# Patient Record
Sex: Male | Born: 1985 | Race: Asian | Hispanic: No | Marital: Married
Health system: Midwestern US, Community
[De-identification: ages and names within clinical notes are randomized; demographics above are authoritative.]

---

## 2010-02-26 LAB — URINALYSIS W/ RFLX MICROSCOPIC
Bilirubin: NEGATIVE
Blood: NEGATIVE
Glucose: NEGATIVE MG/DL
Ketone: 15 MG/DL — AB
Leukocyte Esterase: NEGATIVE
Nitrites: NEGATIVE
Protein: NEGATIVE MG/DL
Specific gravity: 1.025 (ref 1.003–1.030)
Urobilinogen: 1 EU/DL (ref 0.2–1.0)
pH (UA): 7 (ref 5.0–8.0)

## 2010-02-27 LAB — CBC WITH AUTOMATED DIFF
ABS. BASOPHILS: 0 10*3/uL (ref 0.0–0.1)
ABS. EOSINOPHILS: 0 10*3/uL (ref 0.0–0.4)
ABS. LYMPHOCYTES: 1.9 10*3/uL (ref 0.9–3.6)
ABS. MONOCYTES: 0.5 10*3/uL (ref 0.05–1.2)
ABS. NEUTROPHILS: 7.9 10*3/uL (ref 1.8–8.0)
BASOPHILS: 0 % (ref 0–2)
EOSINOPHILS: 0 % (ref 0–5)
HCT: 42.9 % (ref 36.0–48.0)
HGB: 14.7 g/dL (ref 13.0–16.0)
LYMPHOCYTES: 18 % — ABNORMAL LOW (ref 21–52)
MCH: 32.1 PG (ref 24.0–34.0)
MCHC: 34.3 g/dL (ref 31.0–37.0)
MCV: 93.7 FL (ref 74.0–97.0)
MONOCYTES: 5 % (ref 3–10)
MPV: 9.9 FL (ref 9.2–11.8)
NEUTROPHILS: 77 % — ABNORMAL HIGH (ref 40–73)
PLATELET: 338 10*3/uL (ref 135–420)
RBC: 4.58 M/uL — ABNORMAL LOW (ref 4.70–5.50)
RDW: 12.2 % (ref 11.6–14.5)
WBC: 10.3 10*3/uL (ref 4.6–13.2)

## 2010-09-13 LAB — CBC WITH AUTOMATED DIFF
ABS. BASOPHILS: 0.1 10*3/uL — ABNORMAL HIGH (ref 0.0–0.06)
ABS. EOSINOPHILS: 0.1 10*3/uL (ref 0.0–0.4)
ABS. LYMPHOCYTES: 2.2 10*3/uL (ref 0.9–3.6)
ABS. MONOCYTES: 0.4 10*3/uL (ref 0.05–1.2)
ABS. NEUTROPHILS: 5.6 10*3/uL (ref 1.8–8.0)
BASOPHILS: 1 % (ref 0–2)
EOSINOPHILS: 1 % (ref 0–5)
HCT: 43.2 % (ref 36.0–48.0)
HGB: 14.3 g/dL (ref 13.0–16.0)
LYMPHOCYTES: 27 % (ref 21–52)
MCH: 31 PG (ref 24.0–34.0)
MCHC: 33.1 g/dL (ref 31.0–37.0)
MCV: 93.5 FL (ref 74.0–97.0)
MONOCYTES: 5 % (ref 3–10)
MPV: 9.9 FL (ref 9.2–11.8)
NEUTROPHILS: 66 % (ref 40–73)
PLATELET: 349 10*3/uL (ref 135–420)
RBC: 4.62 M/uL — ABNORMAL LOW (ref 4.70–5.50)
RDW: 12.2 % (ref 11.6–14.5)
WBC: 8.4 10*3/uL (ref 4.6–13.2)

## 2010-09-13 LAB — URINALYSIS W/ RFLX MICROSCOPIC
Bilirubin: NEGATIVE
Blood: NEGATIVE
Glucose: NEGATIVE MG/DL
Ketone: NEGATIVE MG/DL
Leukocyte Esterase: NEGATIVE
Nitrites: NEGATIVE
Protein: NEGATIVE MG/DL
Specific gravity: 1.025 (ref 1.003–1.030)
Urobilinogen: 0.2 EU/DL (ref 0.2–1.0)
pH (UA): 5.5 (ref 5.0–8.0)

## 2010-09-13 LAB — METABOLIC PANEL, COMPREHENSIVE
A-G Ratio: 1.4 (ref 0.8–1.7)
ALT (SGPT): 49 U/L (ref 30–65)
AST (SGOT): 17 U/L (ref 15–37)
Albumin: 4.4 g/dL (ref 3.4–5.0)
Alk. phosphatase: 94 U/L (ref 50–136)
Anion gap: 14 mmol/L (ref 5–15)
BUN/Creatinine ratio: 13 (ref 12–20)
BUN: 13 MG/DL (ref 7–18)
Bilirubin, total: 0.5 MG/DL (ref 0.2–1.0)
CO2: 24 MMOL/L (ref 21–32)
Calcium: 8.8 MG/DL (ref 8.4–10.4)
Chloride: 104 MMOL/L (ref 100–108)
Creatinine: 1 MG/DL (ref 0.6–1.3)
GFR est AA: >60 mL/min/{1.73_m2} (ref >60)
GFR est non-AA: >60 mL/min/{1.73_m2} (ref >60)
Globulin: 3.1 g/dL (ref 2.0–4.0)
Glucose: 95 MG/DL (ref 74–99)
Potassium: 3.8 MMOL/L (ref 3.5–5.5)
Protein, total: 7.5 g/dL (ref 6.4–8.2)
Sodium: 142 MMOL/L (ref 136–145)

## 2010-09-13 LAB — LIPASE: Lipase: 106 U/L (ref 73–393)

## 2010-09-25 LAB — URINALYSIS W/ RFLX MICROSCOPIC
Bilirubin: NEGATIVE
Blood: NEGATIVE
Glucose: NEGATIVE MG/DL
Ketone: NEGATIVE MG/DL
Leukocyte Esterase: NEGATIVE
Nitrites: NEGATIVE
Protein: NEGATIVE MG/DL
Specific gravity: 1.02 (ref 1.003–1.030)
Urobilinogen: 0.2 EU/DL (ref 0.2–1.0)
pH (UA): 6 (ref 5.0–8.0)

## 2010-09-25 LAB — DRUG SCREEN, URINE
AMPHETAMINES: NEGATIVE
BARBITURATES: NEGATIVE
BENZODIAZEPINES: NEGATIVE
COCAINE: NEGATIVE
METHADONE: NEGATIVE
OPIATES: NEGATIVE
PCP(PHENCYCLIDINE): NEGATIVE
THC (TH-CANNABINOL): NEGATIVE

## 2012-06-14 NOTE — ED Provider Notes (Signed)
[facility]  EMERGENCY DEPARTMENT TREATMENT REPORT  NAME:  Barry Crawford  SEX:   M  ADMIT: 06/13/2012  DOB:   [birth year]  MR#    [id number]  ROOM:    TIME SEEN: 04 48 PM  ACCT#  [id number]        TIME OF EVALUATION:  1617    CHIEF COMPLAINT:  Shoulder and back pain.    HISTORY OF PRESENT ILLNESS:  This is a 26 year old male who tripped over his nephew's toy yesterday evening   at about 1800 hours, fell on a hard floor.  He did not hit his head.  He was   not knocked unconscious.  He states he also injured his right shoulder,    describes kind of a jamming mechanism to his right shoulder. He has had pain   in his shoulder since that time.  He cannot really lift it up or move it   across his body. He  has pain across his low back, but no pain radiating to   his legs.  No paresthesias, no weakness.  Denies any neck pain.  He denies any   chest pain. Denies any shortness of breath.  Currently rates his pain a 10 on   a 0 to 10 scale.    REVIEW OF SYSTEMS:  CONSTITUTIONAL:  There has been no recent illness.  ENT:  No sore throat, runny nose, or other URI symptoms.   RESPIRATORY:  No cough, shortness of breath, or wheezing.   CARDIOVASCULAR:  No chest pain, chest pressure, or palpitations.   GASTROINTESTINAL:  No vomiting, diarrhea, or abdominal pain.   MUSCULOSKELETAL: Positive right shoulder pain.  Positive for low back pain.  NEUROLOGIC:  He did not hit his head.  There was no loss of consciousness.    Denies any paresthesias or weakness.  No bowel or bladder incontinence or   retention.  Denies complaints in all other systems.     PAST MEDICAL HISTORY:  He denies any medical problems.    MEDICATIONS:  None.    ALLERGIES:  NO KNOWN DRUG ALLERGIES.    SOCIAL HISTORY:  States his wife brought him here today.    PHYSICAL EXAMINATION:  GENERAL:  A well-developed male.  VITAL SIGNS:  Blood pressure 147/89, pulse is 90, respirations 18, temperature   97.8.  HEENT:  Head normocephalic, atraumatic.   NECK:  Supple, nontender.  LUNGS:  Clear to auscultation.  HEART:  Has a regular rate and rhythm.  CHEST/CLAVICLES: There is no sternoclavicular tenderness.  Sternum is   nontender.  He has some tenderness to palpation of the right shoulder and pain   with movement of the right shoulder.  No obvious deformity.  Left shoulder is   nontender.  Remainder of the chest is nontender.  ABDOMEN:  Nontender.  BACK:  He has some tenderness kind of over the parathoracic, posteriorly lower   in paralumbar region as well as some midline tenderness to the lumbar spine.    There is no crepitus or step-off.  He is sitting up on the bed.  HIPS/PELVIS:  Intact, nontender.  EXTREMITIES:  Right upper extremity is well perfused, has a good radial pulse.    Good capillary refill.  Sensation intact.  He is able to wiggle all of his   fingers.  Left upper extremity atraumatic.  Lower extremities are atraumatic.  NEUROLOGIC:  DTRs intact and symmetrical.  Strength testing intact.  Sensation   is intact.    IMPRESSION  AND MANAGEMENT PLAN:  This is a 26 year old male who presents for evaluation after a fall.  Obtain   x-rays of his shoulder and his lumbar spine to evaluate for acute bony injury.    Given a Percocet here in the department, and we will proceed from there.     CONTINUATION BY JULIA C. HUBBARD, PA     IMPRESSION AND MANAGEMENT PLAN:  A  26 year old male with injury to his right shoulder, his lumbar low back.    At this time, x-rays of his lumbar spine will obtained.  Due to midline   tenderness, an x-ray of his right shoulder will be obtained. We will go ahead   and give him a Percocet here in the department.    DIAGNOSTIC STUDIES:  X-rays of lumbar spine show no fracture or dislocation per Radiology.  X-rays   of right shoulder show no evidence of right shoulder fracture or dislocation   per Radiology.    COURSE IN THE EMERGENCY DEPARTMENT:  Findings were discussed.  At this time, discussed symptomatic treatment,    placed in a sling for the next 2 to 3 days.  Ice to the shoulder.  I have   written a  prescription for Norco, Robaxin and Naprosyn to go home with, have   him follow up with ortho if not improving over the next 3 to 4 days, to   certainly seek medical attention at any time for worsening or new concerns.    FINAL DIAGNOSES:  1.  Evaluation, acute right  shoulder strain.  2.  Acute lumbar back strain status post fall.    DISPOSITION AND PLAN:  The patient was discharged home in stable condition to follow up as above.    The patient was examined and evaluated by myself and Jannetta Quint, MD who   agrees with the above assessment and plan.      ___________________  Lucio Edward MD  Dictated By: Maurice Small. Williams Che, [place]    My signature above authenticates this document and my orders, the final   diagnosis (es), discharge prescription (s), and instructions in the [facility]   Pulsecheck record.  PB  D:06/13/2012  T: 06/14/2012 00:14:56  [id number]  Authenticated by Lucio Edward, MD On 06/28/2012 08:44:25 AM

## 2013-07-18 NOTE — ED Provider Notes (Signed)
[facility]  EMERGENCY DEPARTMENT TREATMENT REPORT  NAME:  Barry Crawford  SEX:   M  ADMIT: 07/17/2013  DOB:   19-Oct-1985  MR#    [id number]  ROOM:    TIME DICTATED: 03 18 PM  ACCT#  [id number]        CHIEF COMPLAINT:  Back pain.    HISTORY OF PRESENT ILLNESS:  The patient is a 27 year old male who reports a fall onto his backside about 3   weeks ago.  He reports that he slipped and fell on a wet surface and had pain   across his low back.  He denies any head injury.  He states that he has been   having pain in his low back to his coccyx area and down his right leg since   that fall.  He denies seeing any doctor for this injury.  He has never had any   symptoms previously.  He states that he is walking with a limp secondary to   pain.    REVIEW OF SYSTEMS:  CONSTITUTIONAL:  He denies fevers, chills, malaise.  HEENT:  Head:  No injury.  NEUROLOGIC:  Denies numbness, tingling, weakness, loss of bowel or bladder   control.  MUSCULOSKELETAL:  See HPI.  HEMATOLOGIC:  No history of anemia, bleeding, or bruising.  SKIN:  No rashes, abrasions, ecchymosis, or lacerations.  All other review of   systems is negative.      PAST MEDICAL HISTORY:     He has a normal past medical history except that he has a history of gastric   ulcers.      PAST SURGICAL HISTORY:    He denies any surgeries.    SOCIAL HISTORY:  He smokes tobacco.    ALLERGIES:  TRAMADOL.    MEDICATIONS:  He is not currently taking any medications.      PHYSICAL EXAMINATION:   VITAL SIGNS:   Reviewed and within normal limits.    GENERAL:    He is a healthy-appearing 27 year old Asian male in no acute   distress.  He is having difficulty moving around the exam room and sitting up   on the exam table.    HEENT:  Head is symmetric, atraumatic.    MUSCULOSKELETAL:   SPINE:  He has no midline tenderness of the thoracic or   lumbar spine.  No tenderness to the coccyx.  He does have tenderness in the    sacral area and into the right buttock.  He has a positive straight leg raise   on the right.  He has forward flexion of about 15 degrees secondary to pain.    Extension is 0, rotation is 0, lateral flexion is 0 degrees bilaterally   secondary to pain, as the patient is just unwilling to participate in range of   motion secondary to pain.    SKIN:   Clean, dry and intact.  No ecchymosis.  He has normal perfusion into   both feet.  NEUROLOGIC:  He has 5/5 muscle strength in bilateral lower extremities in hip   flexion and knee flexion, extension.  He has 3/5 weakness and foot drop on the   right foot.  He was observed ambulating with ease in and out of the Emergency   Department, although at the time of my exam, he was walking with a limp.    This improved a great deal after pain medication.     IMAGING:    X-rays were reviewed by myself  and Dr. Buddy Duty.  No official read but there was   no acute malalignment or obvious fractures.  I have a low suspicion for   fracture.      EMERGENCY DEPARTMENT COURSE:    The patient was treated in the Emergency Department with a muscle relaxant and   pain medication.  He has an acute sciatica secondary to the fall.  He has no   insurance, so he was given instructions to but that he was given instructions   for followup and referral information for the [facility] if he   continues to have pain.  He was given a prednisone taper for acute   inflammation.  He was also given Prilosec to protect the stomach with   anti-inflammatories.    DIAGNOSIS:  Lumbar radiculopathy.    DISPOSITION:  Discharged home in stable condition.      The patient was personally evaluated by myself and Dr. Jannetta Quint, who   agrees with the above assessment and plan.      ___________________  Lucio Edward MD  Dictated By: Crecencio Mc, PA-C    My signature above authenticates this document and my orders, the final   diagnosis (es), discharge prescription (s), and instructions in the [facility]   Pulsecheck record.  Nursing notes have been reviewed by the physician/mid-level provider.    If you have any questions please contact [phone].    KB  D:07/18/2013 15:18:08  T: 07/18/2013 17:14:12  [id number]  Authenticated by Lucio Edward, MD On 08/03/2013 10:21:35 PM

## 2013-07-20 NOTE — ED Provider Notes (Signed)
[facility]  EMERGENCY DEPARTMENT TREATMENT REPORT  NAME:  Barry Crawford  SEX:   M  ADMIT: 07/20/2013  DOB:   1986-06-26  MR#    [id number]  ROOM:    TIME DICTATED: 02 32 PM  ACCT#  [id number]        DATE AND TIME OF EVALUATION:  Wednesday, 07/20/2013    CHIEF COMPLAINT:  Left foot injury.    HISTORY OF PRESENT ILLNESS:  This is a 27 year old male who was on his father-in-law's porch today when he   rolled his left ankle.  He denies any fall or head injury.  He says that his   left ankle is painful with 10 out of 10 pain.  He took Vicodin for pain just   prior to the incident happening.    REVIEW OF SYSTEMS:  MUSCULOSKELETAL:  Right joint pain but no swelling.  NEUROLOGIC:  No sensory or motor deficit.  INTEGUMENTARY:  No rashes, lacerations or abrasions.    MEDICATIONS AND ALLERGIES:   Reviewed in Ibex.    PHYSICAL EXAMINATION:  GENERAL APPEARANCE:  Well-developed, well-nourished male lying on exam table   in no acute distress.  VITAL SIGNS:  Blood pressure is 147/88, pulse is 117, respirations 20,   temperature 97.8, O2 saturation 99% on room air.  MUSCULOSKELETAL:  Left ankle without edema, bruising or ecchymosis.  No   obvious deformity is noted.  Dorsalis pedis pulses 2+ bilaterally.  The   patient's feet are warm to the touch.  Capillary refill is brisk.  He does   have reduced range of motion of all the toes on the left foot.    CONTINUED BY SARAH GREGORY, PA-C:     ASSESSMENT AND MANAGEMENT PLAN:     This 27 year old male twisted his left ankle, we will go ahead and do an   x-ray.  Will also give ibuprofen and 8 mg IM morphine as well as 4 mg p.o.   Zofran for pain.    DIAGNOSTIC TEST RESULTS:    X-ray read as no acute bony abnormality by Dr. Sheela Stack.    COURSE IN THE EMERGENCY DEPARTMENT:  The patient remained well appearing throughout his stay in the Emergency   Department.  Will place the patient in an Ace bandage and air splint and give    crutches to go home with.  Will also give a prescription for ibuprofen as he   was here just 3 days ago and got a prescription for Vicodin 20 pills.  He may   continue to take those Vicodin.  Follow up with orthopedics if no better in a   week.       DIAGNOSIS:  Ankle sprain.    DISPOSITION:  The patient dispositioned home in stable condition with a prescription for   ibuprofen.  The patient was personally evaluated by myself and Dr.   Wenda Overland who agrees with the above assessment and plan.     CONTINUED BY SARAH GREGORY, PA-C:     After disposition the patient requested more narcotic pain medication.  He   says he has 2 left from the medications he got 3 days ago and he would like   one day's worth.  Discussed with him the need to take ibuprofen for his foot   inflammation and pain and that he may take only the narcotic pain medication,   Vicodin for severe pain.  We will prescribe him 4 more pills with the   understanding he will not  receive any more from this Emergency Department, he   needs to take ibuprofen.  Follow up with orthopedics and primary.  The patient   was personally evaluated by myself and Dr. Wenda Overland who agrees with the   above assessment and plan.      ___________________  Liberty Handy Himmel-Satvik Parco DO  Dictated By: Thea Silversmith, PA-C    My signature above authenticates this document and my orders, the final  diagnosis (es), discharge prescription (s), and instructions in the [facility]   Pulsecheck record.  Nursing notes have been reviewed by the physician/mid-level provider.    If you have any questions please contact [phone].    JMB  D:07/20/2013 14:32:49  T: 07/20/2013 14:49:57  [id number]  Authenticated by Carolin Guernsey, DO On 07/22/2013 07:12:59 AM

## 2013-07-25 MED ORDER — IBUPROFEN 400 MG TAB
400 mg | ORAL | Status: AC
Start: 2013-07-25 — End: 2013-07-25
  Administered 2013-07-25: 20:00:00 via ORAL

## 2013-07-25 MED ORDER — HYDROCODONE-ACETAMINOPHEN 5 MG-325 MG TAB
5-325 mg | ORAL_TABLET | ORAL | Status: AC | PRN
Start: 2013-07-25 — End: ?

## 2013-07-25 MED ORDER — HYDROCODONE-ACETAMINOPHEN 5 MG-325 MG TAB
5-325 mg | Freq: Once | ORAL | Status: AC
Start: 2013-07-25 — End: 2013-07-25
  Administered 2013-07-25: 20:00:00 via ORAL

## 2013-07-25 MED ORDER — NAPROXEN 500 MG TAB
500 mg | ORAL_TABLET | Freq: Two times a day (BID) | ORAL | Status: AC
Start: 2013-07-25 — End: 2013-08-04

## 2013-07-25 NOTE — ED Provider Notes (Signed)
HPI Comments: Barry Crawford is a 27 y.o. male presents to the emergency department c/o left foot and ankle pain. Pt states while wearing flip flops 3 days ago he stepped on a screw. He everted his ankle to prevent the screw from puncturing his foot. The screw did not puncture his foot but he is having pain at the site and pain in his ankle. He has been able to ambulate. He placed an ace wrap on his foot. He denies fever, chills, SOB, chest pain, abdominal pain, NVD, headache and dizziness. He expresses no other complaints at this time.     The history is provided by the patient.        No past medical history on file.     No past surgical history on file.      No family history on file.     History     Social History   ??? Marital Status: SINGLE     Spouse Name: N/A     Number of Children: N/A   ??? Years of Education: N/A     Occupational History   ??? Not on file.     Social History Main Topics   ??? Smoking status: Not on file   ??? Smokeless tobacco: Not on file   ??? Alcohol Use: Not on file   ??? Drug Use: Not on file   ??? Sexually Active: Not on file     Other Topics Concern   ??? Not on file     Social History Narrative   ??? No narrative on file                  ALLERGIES: Tramadol      Review of Systems   Constitutional: Negative for fever and chills.   HENT: Negative for congestion and sore throat.    Eyes: Negative for redness and visual disturbance.   Respiratory: Negative for shortness of breath.    Cardiovascular: Negative for chest pain.   Gastrointestinal: Negative for vomiting, abdominal pain and diarrhea.   Genitourinary: Negative for difficulty urinating.   Musculoskeletal: Negative for myalgias and arthralgias.        (+) left foot/ankle pain.   Skin: Negative for rash.   Neurological: Negative for headaches.   Psychiatric/Behavioral: Negative for dysphoric mood.   All other systems reviewed and are negative.        Filed Vitals:    07/25/13 1456   BP: 123/90   Pulse: 118   Temp: 98.1 ??F (36.7 ??C)   Resp: 18   SpO2:  100%            Physical Exam   Nursing note and vitals reviewed.  Constitutional: He is oriented to person, place, and time. He appears well-developed and well-nourished. No distress.   HENT:   Head: Normocephalic and atraumatic.   Mouth/Throat: Oropharynx is clear and moist.   Eyes: Conjunctivae are normal. Pupils are equal, round, and reactive to light. No scleral icterus.   Neck: Normal range of motion. Neck supple. No tracheal deviation present.   Cardiovascular: Intact distal pulses.    Pulmonary/Chest: Effort normal and breath sounds normal. No respiratory distress. He has no wheezes.   Abdominal: Soft. Bowel sounds are normal. He exhibits no distension. There is no tenderness.   Musculoskeletal: Normal range of motion. He exhibits no edema.   Left foot - mild tenderness just below medial and lateral malleolus. No puncture wounds. Good capillary refill. Dorsalis pedis pulse  intact. Patient has ace wrap on foot. Ambulating, no limp   Lymphadenopathy:     He has no cervical adenopathy.   Neurological: He is alert and oriented to person, place, and time. No cranial nerve deficit.   Skin: Skin is warm and dry. He is not diaphoretic.   Psychiatric: He has a normal mood and affect.        MDM     Differential Diagnosis; Clinical Impression; Plan:     Likely ankle sprain    Pt ambulating on ankle every since injury.   dont feel need xray    He has it ace wrapped.  Elevated HR likely associated with his pain    Dose analgesia given in ED, and give Rx motrin, vicodin  Fu given.     Repeat HR normal  Risk of Significant Complications, Morbidity, and/or Mortality:   Presenting problems:  Low  Diagnostic procedures:  Low  Management options:  Low  Progress:   Patient progress:  Stable      Procedures    -------------------------------------------------------------------------------------------------------------------  PROGRESS NOTES:  2:57 PM: Barry Schlein, DO discussed treatment plan with the patient. Discussed  diagnosis, prescriptions and follow up instructions with the patient. Answered patient's questions. Patient is stable and ready for discharge after treatment is complete.    CONSULTATIONS:  None    ORDERS:  Orders Placed This Encounter   ??? HYDROcodone-acetaminophen (NORCO) 5-325 mg per tablet   ??? naproxen (NAPROSYN) 500 mg tablet   ??? HYDROcodone-acetaminophen (NORCO) 5-325 mg per tablet 1 tablet   ??? ibuprofen (MOTRIN) tablet 800 mg       MEDICATIONS:  Medications   HYDROcodone-acetaminophen (NORCO) 5-325 mg per tablet 1 tablet (1 tablet Oral Given 07/25/13 1517)   ibuprofen (MOTRIN) tablet 800 mg (800 mg Oral Given 07/25/13 1517)        RADIOLOGY RESULTS:    None    LAB & EKG RESULTS:   No results found for this or any previous visit (from the past 12 hour(s)).     DISPOSITION:   Diagnosis:   1. Left ankle sprain, initial encounter          Disposition: Discharged home    Follow-up Information    Follow up With Details Comments Contact Info    [facility]  schedule an appointment for further evaluation and treatment [address]  [place] [address]  [phone]    [facility] EMERGENCY DEPT  If symptoms worsen [address]  [place] [address]  [phone]          Current Discharge Medication List      START taking these medications    Details   HYDROcodone-acetaminophen (NORCO) 5-325 mg per tablet Take 1 tablet by mouth every four (4) hours as needed for Pain.  Qty: 15 tablet, Refills: 0      naproxen (NAPROSYN) 500 mg tablet Take 1 tablet by mouth two (2) times daily (with meals) for 10 days. PRN pain  Qty: 20 tablet, Refills: 0              Teacher, early years/pre written by: Barry Crawford, scribing for and in the presence of Barry Schlein, DO ED Provider. 3:13 PM    PROVIDER ATTESTATION STATEMENT   I personally performed the services described in the documentation, reviewed the documentation, as recorded by the scribe in my presence, and it accurately and  completely records my words and actions.  Barry Mainland Keerthi Hazell, DO. 3:22 PM   -------------------------------------------------------------------------------------------------------------------

## 2013-07-25 NOTE — ED Notes (Signed)
"  I almost stepped on a screw on a deck and instead I twisted my left ankle and it feels like it's worse. It's been three days."

## 2013-07-25 NOTE — ED Notes (Signed)
Patient armband removed and given to patient to take home.  Patient was informed of the privacy risks if armband lost or stolen  I have reviewed discharge instructions with the patient.  The patient verbalized understanding.    See scanned signature page for discharge signature.

## 2013-08-04 LAB — METABOLIC PANEL, COMPREHENSIVE
ALT (SGPT): 21 U/L (ref 12–78)
AST (SGOT): 8 U/L — ABNORMAL LOW (ref 15–37)
Albumin: 4.4 gm/dl (ref 3.4–5.0)
Alk. phosphatase: 68 U/L (ref 45–117)
BUN: 12 mg/dl (ref 7–25)
Bilirubin, total: 0.3 mg/dl (ref 0.2–1.0)
CO2: 22 mEq/L (ref 21–32)
Calcium: 8.6 mg/dl (ref 8.5–10.1)
Chloride: 106 mEq/L (ref 98–107)
Creatinine: 1 mg/dl (ref 0.6–1.3)
GFR est AA: >60
GFR est non-AA: >60
Glucose: 106 mg/dl (ref 74–106)
Potassium: 3.2 mEq/L — ABNORMAL LOW (ref 3.5–5.1)
Protein, total: 7.4 gm/dl (ref 6.4–8.2)
Sodium: 138 mEq/L (ref 136–145)

## 2013-08-04 LAB — CBC WITH AUTOMATED DIFF
BASOPHILS: 0.6 % (ref 0–3)
EOSINOPHILS: 1.2 % (ref 0–5)
HCT: 40.4 % (ref 37.0–50.0)
HGB: 14 gm/dl (ref 12.4–17.2)
IMMATURE GRANULOCYTES: 0.3 % (ref 0.0–3.0)
LYMPHOCYTES: 22.4 % — ABNORMAL LOW (ref 28–48)
MCH: 31.9 pg (ref 23.0–34.6)
MCHC: 34.7 gm/dl (ref 30.0–36.0)
MCV: 92 fL (ref 80.0–98.0)
MONOCYTES: 7.9 % (ref 1–13)
MPV: 9.6 fL (ref 6.0–10.0)
NEUTROPHILS: 67.6 % — ABNORMAL HIGH (ref 34–64)
NRBC: 0 (ref 0–0)
PLATELET: 388 10*3/uL (ref 140–450)
RBC: 4.39 M/uL (ref 3.80–5.70)
RDW-SD: 44.1 — ABNORMAL HIGH (ref 35.1–43.9)
WBC: 11.6 10*3/uL — ABNORMAL HIGH (ref 4.0–11.0)

## 2013-08-04 LAB — POC URINE MACROSCOPIC
Bilirubin: NEGATIVE
Blood: NEGATIVE
Glucose: NEGATIVE mg/dl
Ketone: NEGATIVE mg/dl
Leukocyte Esterase: NEGATIVE
Nitrites: NEGATIVE
Protein: NEGATIVE mg/dl
Specific gravity: >=1.03 (ref 1.005–1.030)
Urobilinogen: 0.2 EU/dl (ref 0.0–1.0)
pH (UA): 6.5 (ref 5–9)

## 2013-08-04 LAB — LIPASE: Lipase: 106 U/L (ref 73–393)

## 2013-08-04 LAB — POC FECAL OCCULT BLOOD: Occult blood, stool: NEGATIVE — AB

## 2013-08-04 NOTE — ED Provider Notes (Signed)
[facility]  EMERGENCY DEPARTMENT TREATMENT REPORT  NAME:  Barry Crawford, Barry Crawford  SEX:   M  ADMIT: 08/04/2013  DOB:   05-Jul-1986  MR#    [id number]  ROOM:    TIME DICTATED: 07 07 AM  ACCT#  [id number]        PRIMARY CARE PHYSICIAN:  None.    CHIEF COMPLAINT:  Abdominal burning since 2300.    HISTORY OF PRESENT ILLNESS:  A 28 year old gentleman with a history of peptic ulcer disease who comes in   with epigastric abdominal pain that he describes as sharp and burning,   constant since 11:00 p.m. last night.  He has chronic back and leg pain and he   had run out of his pain medication, so he started taking Bayer aspirin   yesterday afternoon.  He knows he is not supposed to, but he was in so much   pain he went ahead and took it.  He denies any vomiting, admits to nausea.  He   had a bowel movement this morning; it was normal, but he does admit that the   color was reddish-brown.  He denies any other alleviating or aggravating   factors associated with his condition.    REVIEW OF SYSTEMS:  CONSTITUTIONAL:  No fever, chills or weight loss.   RESPIRATORY:  No shortness of breath, cough or wheezing.   CARDIOVASCULAR:  No chest pain, pressure or palpitations.   GENITOURINARY:  Admits to dysuria and frequency.  That has been ongoing for   years.  MUSCULOSKELETAL:  No joint pain or swelling.   INTEGUMENTARY:  No rashes.     PAST MEDICAL HISTORY:  Peptic ulcer disease.    SOCIAL HISTORY:  Admits to tobacco use.    FAMILY HISTORY:  Noncontributory.    CURRENT MEDICATIONS:  Listed and reviewed in [facility].    KNOWN ALLERGIES:  TRAMADOL.    PHYSICAL EXAMINATION:  VITAL SIGNS:  Blood pressure 151/104, pulse 114, respirations 22, temperature   is 97.9, pain is 8 out 10, O2 saturations 98% on room air.  GENERAL APPEARANCE:  Patient appears well developed and well nourished.    Appearance and behavior are age and situation appropriate.   HEENT:  Eyes:  Conjunctivae clear, lids normal.  Pupils equal, symmetrical and    normally reactive.  Mouth/Throat:  Surfaces of the pharynx, palate and tongue   are pink, moist and without lesions.  The lips do appear dry.  NECK:  Supple, nontender, symmetrical, no masses or JVD, trachea midline,   thyroid not enlarged, nodular or tender.   LYMPHATICS:  No cervical or submandibular lymphadenopathy palpated.   RESPIRATORY:  Clear and equal breath sounds.  No respiratory distress,   tachypnea or accessory muscle use.   CARDIOVASCULAR:   Heart is tachycardic without murmurs, gallops, rubs or   thrills.  CHEST:  Chest symmetrical without masses or tenderness.   GASTROINTESTINAL:  The abdomen is soft, significantly tender to palpation in   the epigastrium.  No abdominal or inguinal masses appreciated by inspection or   palpation.   RECTAL:  The patient does demonstrate hemorrhoids.  Sphincter tone is normal.    The stool is brown, guaiac negative.  MUSCULOSKELETAL:  Stance and gait appear normal.   SKIN:  Warm and dry without rashes.     INITIAL ASSESSMENT AND MANAGEMENT PLAN:  A 28 year old gentleman with a history of peptic ulcer disease and recent   NSAID use who comes in with epigastric abdominal pain.  His guaiac was   negative.  I suspect this is likely gastritis, but will obtain labs to rule   out pancreatitis, hepatitis.  We will check urine for infection since he   complained of urinary frequency.  We will give him IV Protonix, Pepcid and   Zofran.  We will give him a liter of normal saline for hydration as he is   tachycardic, although this may be related to pain, and we will subsequently   give him a GI cocktail.  We will additionally get a plain film to rule out   free air.    CONTINUATION BY NICHOLE V. RICE, PA-C:    DIAGNOSTIC STUDY RESULTS:  Occult blood was negative.  Abdomen complete:  Moderate amount of feces within   the colon.  Abdomen otherwise negative.  No free air.  CMP:  Potassium 3.2,   AST 8, otherwise unremarkable.  Lipase normal.  CBC:  White count was 11.6,    otherwise unremarkable.  Urinalysis was negative.    CLINICAL COURSE:  During the patient's stay in the Emergency Department, he did not develop any   new or worsening symptoms, remained stable.  He was given a GI cocktail, IV   Pepcid and Protonix.  He was given a liter of normal saline for hydration.  He   was additionally given potassium to replace his mild deficiency.  At this   time, CT imaging of the abdomen does not appear to be necessary.  We do not   suspect significant infectious or surgical pathology requiring advanced   imaging.  It was discussed with the patient that the etiology of their   abdominal pain is not clear and a surgical or infectious cause for their pain   has not been ruled out. Patient is encouraged to return immediately if their   pain does not improve or worsens such as localization of the pain, fever,   intractable vomiting, new symptoms develop or any other concern they may have.    I suspect this is gastritis.  We are recommending that he does not take   NSAIDs.  He is to follow up with his orthopedic surgeon as scheduled.  He has   4 Vicodin at home that he will take.    CLINICAL IMPRESSION AND DIAGNOSIS:  Epigastric abdominal pain.    DISPOSITION AND PLAN:  The patient is discharged home in stable condition with discharge instructions   on the same.  He is to follow up with primary care, return to the ER if   condition worsens or new symptoms develop.  Encourage fluids.  Prescriptions   for Carafate, Zofran and Prilosec were given.  The patient was personally   evaluated by myself and Dr. Victorino Dike Himmel-Lilybelle Mayeda who agrees with the above   assessment and plan.      ___________________  Liberty Handy Himmel-Jerriann Schrom DO  Dictated By: Dayton Scrape Rice, PA-C    My signature above authenticates this document and my orders, the final  diagnosis (es), discharge prescription (s), and instructions in the [facility]   Pulsecheck record.   Nursing notes have been reviewed by the physician/mid-level provider.    If you have any questions please contact [phone].    SC  D:08/04/2013 07:07:50  T: 08/04/2013 08:27:54  [id number]  Authenticated by Carolin Guernsey, DO On 08/13/2013 03:55:05 AM

## 2013-08-05 LAB — DRUG SCREEN, URINE
Amphetamine: NEGATIVE
Barbiturates: POSITIVE — AB
Benzodiazepines: NEGATIVE
Cocaine: NEGATIVE
METHAMPHETAMINE,URINE: NEGATIVE
Marijuana: POSITIVE — AB
Opiates: NEGATIVE
Phencyclidine: NEGATIVE
TRICYCLICS SCREEN, URINE: NEGATIVE

## 2013-08-05 LAB — CBC WITH AUTOMATED DIFF
BASOPHILS: 0.4 % (ref 0–3)
EOSINOPHILS: 0.3 % (ref 0–5)
HCT: 43.9 % (ref 37.0–50.0)
HGB: 14.9 gm/dl (ref 12.4–17.2)
IMMATURE GRANULOCYTES: 0.3 % (ref 0.0–3.0)
LYMPHOCYTES: 16.9 % — ABNORMAL LOW (ref 28–48)
MCH: 31.6 pg (ref 23.0–34.6)
MCHC: 33.9 gm/dl (ref 30.0–36.0)
MCV: 93 fL (ref 80.0–98.0)
MONOCYTES: 7.9 % (ref 1–13)
MPV: 10.1 fL — ABNORMAL HIGH (ref 6.0–10.0)
NEUTROPHILS: 74.2 % — ABNORMAL HIGH (ref 34–64)
NRBC: 0 (ref 0–0)
PLATELET COMMENTS: NORMAL
PLATELET: 428 10*3/uL (ref 140–450)
RBC: 4.72 M/uL (ref 3.80–5.70)
RDW-SD: 46.3 — ABNORMAL HIGH (ref 35.1–43.9)
WBC: 15.5 10*3/uL — ABNORMAL HIGH (ref 4.0–11.0)

## 2013-08-05 LAB — TSH 3RD GENERATION: TSH: 0.896 u[IU]/mL (ref 0.358–3.740)

## 2013-08-05 LAB — METABOLIC PANEL, BASIC
BUN: 14 mg/dl (ref 7–25)
CO2: 23 mEq/L (ref 21–32)
Calcium: 9.5 mg/dl (ref 8.5–10.1)
Chloride: 110 mEq/L — ABNORMAL HIGH (ref 98–107)
Creatinine: 1 mg/dl (ref 0.6–1.3)
GFR est AA: >60
GFR est non-AA: >60
Glucose: 98 mg/dl (ref 74–106)
Potassium: 3.8 mEq/L (ref 3.5–5.1)
Sodium: 141 mEq/L (ref 136–145)

## 2013-08-05 LAB — ACETAMINOPHEN: Acetaminophen level: <2 ug/mL — ABNORMAL LOW (ref 10.0–30.0)

## 2013-08-05 LAB — D DIMER: D DIMER: 0.41 ug/mL (FEU) (ref 0.01–0.50)

## 2013-08-05 LAB — T4, FREE: Free T4: 1.04 ng/dl (ref 0.76–1.46)

## 2013-08-05 LAB — TROPONIN I: Troponin-I: <0.015 ng/ml (ref 0.00–0.09)

## 2013-08-05 LAB — ETHYL ALCOHOL: ALCOHOL(ETHYL),SERUM: <3 mg/dl (ref 0.0–9.0)

## 2013-08-05 LAB — SALICYLATE: Salicylate: 27.5 mg/dl — ABNORMAL HIGH (ref 2.8–20.0)

## 2013-08-05 LAB — D-DIMER, QUANTITATIVE: D-Dimer, Quant: 0.41 ug/mL (FEU) (ref 0.01–0.50)

## 2013-08-05 NOTE — ED Provider Notes (Signed)
[facility]  EMERGENCY DEPARTMENT TREATMENT REPORT  NAME:  Barry Crawford  SEX:   M  ADMIT: 08/05/2013  DOB:   September 05, 1985  MR#    [id number]  ROOM:    TIME DICTATED: 07 25 PM  ACCT#  [id number]        PRIMARY CARE PHYSICIAN:  Not known.    EVALUATION TIME:  1500.    CHIEF COMPLAINT:  Anxiety.    HISTORY OF PRESENT ILLNESS:  A 28 year old male who comes in saying he had what he thinks is a panic   attack.  He says he has been hearing voices on a continuous loop for the last   2 days and it has been making him very upset and anxious.  He cannot get it to   stop.  He said today he was having a hard time telling if he was asleep or   awake and he almost cut his wrist to see if he could tell the difference but   did not.  He denies wanting to hurt himself, having suicidal or homicidal   thoughts, but also states that he feels like he does not want to be alive if   he is going to feel like this.  Tells me physically he has been having chest   pain for the past week that is sharp, stabbing, knife-like in the substernal   part of his center chest.  It is worse with movement and breathing.  Today he   felt short of breath, but otherwise has not been dyspneic.  He has not had   fevers, chills or cough.  He has been taking aspirin, Bayer Body, 4 to 5 times   a day over the counter to help with the pain, as well as Tylenol PM but   states he is not taking more than is recommended on the box.    REVIEW OF SYSTEMS:  CONSTITUTIONAL:  He denies fevers or chills.  EYES:  No visual symptoms.  ENT:  Says he is hearing voices, denies ear pain or pressure, some tinnitus.  ENDOCRINE:  No diabetic symptoms.   RESPIRATORY:  Short of breath earlier, resolved.  Denies cough.  CARDIOVASCULAR:  Sharp, stabbing chest pain.  GASTROINTESTINAL:  No vomiting, diarrhea, or abdominal pain.   MUSCULOSKELETAL:  No joint pain or swelling.   INTEGUMENTARY:  No rashes.   NEUROLOGICAL:  No headaches, sensory or motor symptoms.    PSYCHIATRIC:  Very anxious, hearing voices, auditory hallucination.    PAST MEDICAL HISTORY:  Peptic ulcer disease, bipolar, depression.    SOCIAL HISTORY:  He is a smoker.    FAMILY HISTORY:  Negative for heart disease prematurely.      ALLERGIES:  TRAMADOL.    MEDICATIONS:  Currently none.    PHYSICAL EXAMINATION:  GENERAL:  The patient is very anxious appearing.  VITAL SIGNS:  Blood pressure 143/89, heart rate 120, respiratory rate 18,   temperature 98.5, 6/10 discomfort, 100% on room air.  HEENT:  Eyes:  Conjunctivae clear, lids normal.  Pupils equal, symmetrical,   and normally reactive.  Mouth/Throat:  Surfaces of the pharynx, palate, and   tongue are pink, moist, and without lesions.   NECK:  Supple, nontender, symmetrical, no masses or JVD, trachea midline,   thyroid not enlarged, nodular, or tender.  No cervical or submandibular   lymphadenopathy palpated.   RESPIRATORY:  Clear and equal breath sounds.  No respiratory distress,   tachypnea, or accessory muscle use.  CARDIOVASCULAR:  Heart regular, without murmurs, gallops, rubs, or thrills.   CHEST:  Chest wall symmetric, tender over the sternum.    ABDOMEN:  Soft, benign, nontender.  MUSCULOSKELETAL:  Nails:  No clubbing or deformities.  Nailbeds pink with   prompt capillary refill.   SKIN:  Warm and dry without rashes.   NEUROLOGIC:  Awake, alert, supple neck.  Does not appear toxic.  PSYCHIATRIC:  Very anxious appearing.    CONTINUATION BY  Micki RileySARA TSUCHITANI, M.D.     DIAGNOSTIC STUDIES:  EKG showing a normal sinus rhythm, no ischemic change. CBC with white count    15.5, hemoglobin and platelets are normal. D-dimer is negative.  BMP    unremarkable.  Cardiac enzymes are negative.  Thyroid studies within normal   limits. Acetaminophen negative. Alcohol negative.  Salicylate 27.5, repeat is    20.3, repeat is 16.7.  Urine dip  is negative. Urine barbiturates and   marijuana positive.  X-ray of the chest, normal study, read by Dr. Noel Geroldohen.    ER COURSE:   The patient presented with hallucinations.  He was very  anxious. He remained   anxious after getting Ativan IV  times 2, did not make him sleepy at all.  He   just remained anxious and tachycardic  He was given 2 liters of fluid. He was   given a nicotine patch.  After he had been medicated for the second time, I    was notified that the patient became more agitated, seemed to be having   increased hallucinations, that he was hearing his ex-wife outside the room   when she was not there. He pulled out his IV and so he was given IM Geodon   after which he was resting comfortably. Currently, his tachycardia has   resolved with a pulse of 77.  He has had some dinner. He has been seen by   Gerline LegacyLarry Farmer, who is calling the [facility]  to see  the  patient.  Preliminary   diagnoses are auditory hallucinations, anxiety, chest pain. Please  note, the   patient was also discussed with Poison Control, given the initial elevated   salicylate level. The patient has no anion gap. They recommend 2 more levels   to ensure that they were decreasing, and if they were, no further    intervention would be needed.  That is why the 2 additional salicylate levels   were checked.      PRELIMINARY DIAGNOSES:  1. Hallucinations.  2. Anxiety.  3. Chest pain.    CONTINUATION BY Vinnie LangtonERIK KISA, MD:    COURSE IN EMERGENCY DEPARTMENT:  I assumed care of this patient pending further psychiatric workup.  White   count 15.5, hematocrit 43 and hemoglobin 14.  D-dimer was normal.  Normal   electrolytes were noted.   the patient's acetaminophen level was zero,   salicylate level decreasing from 27.  Cardiac enzymes were negative for   ischemia or infarction.  Marijuana and barbiturates noted on drug screen.    Chest x-ray showed no acute abnormalities per radiologist.  White count 15.5,   hematocrit 43 and hemoglobin 14.  Urinalysis was negative for infection,   ketones noted.        COURSE IN EMERGENCY DEPARTMENT:     The patient had been hydrated with IV fluids here and given Ativan p.r.n.    Received Geodon 10 mg IM prior to my arrival.  The patient required 1 more   dose of  Ativan during my care here and was cooperative otherwise.  He was seen   by [facility] crisis worker who went and petitioned for a TDO   for him which is being arranged at this time.    CLINICAL IMPRESSION:  1.  Acute auditory hallucinations secondary to exacerbation of schizophrenia.  2.  Anxiety.  3.  Evaluation of chest pain.      ___________________  Judeen Hammans Tsuchitani MD  Dictated By: Marlana Salvage, PA    My signature above authenticates this document and my orders, the final  diagnosis (es), discharge prescription (s), and instructions in the [facility]   Pulsecheck record.  Nursing notes have been reviewed by the physician/mid-level provider.    If you have any questions please contact [phone].    AK  D:08/05/2013 19:25:06  T: 08/05/2013 21:30:86  [id number]  Authenticated by Tana Conch, MD On 08/22/2013 09:27:38 PM

## 2013-08-06 LAB — SALICYLATE
Salicylate: 20.3 mg/dl — ABNORMAL HIGH (ref 2.8–20.0)
Salicylate: 16.7 mg/dl (ref 2.8–20.0)

## 2013-08-06 LAB — URINALYSIS W/ RFLX MICROSCOPIC
Bilirubin: NEGATIVE
Blood: NEGATIVE
Glucose: NEGATIVE mg/dl
Ketone: 15 mg/dl — AB
Leukocyte Esterase: NEGATIVE
Nitrites: NEGATIVE
Protein: NEGATIVE mg/dl
Specific gravity: 1.02 (ref 1.005–1.030)
Urobilinogen: 0.2 mg/dl (ref 0.0–1.0)
pH (UA): 6 (ref 5.0–9.0)

## 2013-08-15 LAB — CBC WITH AUTOMATED DIFF
ABS. BASOPHILS: 0.1 10*3/uL — ABNORMAL HIGH (ref 0.0–0.06)
ABS. EOSINOPHILS: 0.1 10*3/uL (ref 0.0–0.4)
ABS. LYMPHOCYTES: 2.3 10*3/uL (ref 0.9–3.6)
ABS. MONOCYTES: 0.5 10*3/uL (ref 0.05–1.2)
ABS. NEUTROPHILS: 4.2 10*3/uL (ref 1.8–8.0)
BASOPHILS: 1 % (ref 0–2)
EOSINOPHILS: 2 % (ref 0–5)
HCT: 40.2 % (ref 36.0–48.0)
HGB: 12.9 g/dL — ABNORMAL LOW (ref 13.0–16.0)
LYMPHOCYTES: 32 % (ref 21–52)
MCH: 32.1 PG (ref 24.0–34.0)
MCHC: 32.1 g/dL (ref 31.0–37.0)
MCV: 100 FL — ABNORMAL HIGH (ref 74.0–97.0)
MONOCYTES: 8 % (ref 3–10)
MPV: 9.5 FL (ref 9.2–11.8)
NEUTROPHILS: 57 % (ref 40–73)
PLATELET: 365 10*3/uL (ref 135–420)
RBC: 4.02 M/uL — ABNORMAL LOW (ref 4.70–5.50)
RDW: 13.5 % (ref 11.6–14.5)
WBC: 7.2 10*3/uL (ref 4.6–13.2)

## 2013-08-15 LAB — METABOLIC PANEL, COMPREHENSIVE
A-G Ratio: 1.3 (ref 0.8–1.7)
ALT (SGPT): 52 U/L (ref 16–61)
AST (SGOT): 17 U/L (ref 15–37)
Albumin: 3.7 g/dL (ref 3.4–5.0)
Alk. phosphatase: 71 U/L (ref 45–117)
Anion gap: 10 mmol/L (ref 3.0–18)
BUN/Creatinine ratio: 10 — ABNORMAL LOW (ref 12–20)
BUN: 8 MG/DL (ref 7.0–18)
Bilirubin, total: 0.2 MG/DL (ref 0.2–1.0)
CO2: 26 mmol/L (ref 21–32)
Calcium: 8.7 MG/DL (ref 8.5–10.1)
Chloride: 108 mmol/L (ref 100–108)
Creatinine: 0.8 MG/DL (ref 0.6–1.3)
GFR est AA: >60 mL/min/{1.73_m2} (ref >60)
GFR est non-AA: >60 mL/min/{1.73_m2} (ref >60)
Globulin: 2.9 g/dL (ref 2.0–4.0)
Glucose: 99 mg/dL (ref 74–99)
Potassium: 4.2 mmol/L (ref 3.5–5.5)
Protein, total: 6.6 g/dL (ref 6.4–8.2)
Sodium: 144 mmol/L (ref 136–145)

## 2013-08-15 LAB — URINALYSIS W/ RFLX MICROSCOPIC
Bilirubin: NEGATIVE
Blood: NEGATIVE
Glucose: NEGATIVE mg/dL
Leukocyte Esterase: NEGATIVE
Nitrites: NEGATIVE
Protein: NEGATIVE mg/dL
Specific gravity: 1.02 (ref 1.003–1.030)
Urobilinogen: 0.2 EU/dL (ref 0.2–1.0)
pH (UA): 8 (ref 5.0–8.0)

## 2013-08-15 LAB — LIPASE: Lipase: 94 U/L (ref 73–393)

## 2013-08-15 MED ORDER — SODIUM CHLORIDE 0.9% BOLUS IV
0.9 % | Freq: Once | INTRAVENOUS | Status: AC
Start: 2013-08-15 — End: 2013-08-15
  Administered 2013-08-15: 18:00:00 via INTRAVENOUS

## 2013-08-15 MED ORDER — METHOCARBAMOL 500 MG TAB
500 mg | ORAL_TABLET | Freq: Four times a day (QID) | ORAL | Status: AC
Start: 2013-08-15 — End: 2013-08-20

## 2013-08-15 NOTE — ED Provider Notes (Signed)
I was personally available for consultation in the emergency department.  I have not seen the patient but have reviewed the chart prior to discharge and agree with the documentation recorded by the MLP, including the assessment, treatment plan, and disposition.  Halayna Blane, MD

## 2013-08-15 NOTE — ED Provider Notes (Signed)
HPI Comments: Barry Crawford is a 28 y.o. male presents to the emergency department c/o lower back pain and pain over left lateral chest wall or abd.the pt started having lower back pain x 1 week, pain radiates down right leg. Also, 2 days ago he tripped over a baby's toy, hit left lateral chest, has pain since. Tylenol does not help. Cannot take NSAIDs dt ulcers and cannot take Tramadol d/t seizures. He denies fever, chills, SOB, chest pain, abdominal pain, NVD, headache and dizziness. No B/B dysfunction, saddle anesthesia, paresthesia, numbness. He expresses no other complaints at this time.   He denies using any narcs since 2009.     Patient is a 28 y.o. male presenting with back pain and abdominal pain. The history is provided by the patient.   Back Pain   Associated symptoms include abdominal pain (LUQ vs lateral left chest wall pain per HPI). Pertinent negatives include no chest pain, no numbness and no weakness.   Abdominal Pain   Associated symptoms include back pain. Pertinent negatives include no diarrhea, no vomiting, no arthralgias, no myalgias and no chest pain.        Past Medical History   Diagnosis Date   ??? Peptic ulcer    ??? Hernia         History reviewed. No pertinent past surgical history.      History reviewed. No pertinent family history.     History     Social History   ??? Marital Status: MARRIED     Spouse Name: N/A     Number of Children: N/A   ??? Years of Education: N/A     Occupational History   ??? Not on file.     Social History Main Topics   ??? Smoking status: Former Smoker     Quit date: 08/14/2013   ??? Smokeless tobacco: Not on file   ??? Alcohol Use: No   ??? Drug Use: No   ??? Sexually Active: Not on file     Other Topics Concern   ??? Not on file     Social History Narrative   ??? No narrative on file                  ALLERGIES: Tramadol      Review of Systems   Constitutional: Negative for chills.   HENT: Negative.  Negative for congestion and sore throat.    Eyes: Negative for redness and visual  disturbance.   Respiratory: Negative for shortness of breath and wheezing.    Cardiovascular: Negative.  Negative for chest pain and palpitations.   Gastrointestinal: Positive for abdominal pain (LUQ vs lateral left chest wall pain per HPI). Negative for vomiting and diarrhea.   Genitourinary: Negative for difficulty urinating.   Musculoskeletal: Positive for back pain. Negative for myalgias and arthralgias.   Skin: Negative for rash.   Neurological: Negative for dizziness, syncope, weakness and numbness.   Psychiatric/Behavioral: Negative for dysphoric mood.   All other systems reviewed and are negative.        Filed Vitals:    08/15/13 1022 08/15/13 1253   BP: 148/97 140/93   Pulse: 112 112   Temp: 97.9 ??F (36.6 ??C)    Resp: 14 16   Weight: 65.318 kg (144 lb)    SpO2: 100% 99%            Physical Exam   Nursing note and vitals reviewed.  Constitutional: He is oriented to person, place, and  time. He appears well-developed and well-nourished. No distress.   HENT:   Head: Normocephalic and atraumatic.   Mouth/Throat: Oropharynx is clear and moist.   Eyes: Conjunctivae are normal. Pupils are equal, round, and reactive to light. No scleral icterus.   strabismus   Neck: Normal range of motion. Neck supple. No tracheal deviation present.   No rigidity, erythema, edema, muscular or midline tenderness    Cardiovascular: Intact distal pulses.  Tachycardia present.    Pulmonary/Chest: Effort normal and breath sounds normal. No respiratory distress. He has no wheezes. He has no rales. He exhibits tenderness.   Abdominal: Soft. Bowel sounds are normal. He exhibits no distension. There is tenderness (LUQ, seems like referred pain from chest wall). There is no rebound and no guarding.   Musculoskeletal: Normal range of motion. He exhibits no edema.   Left foot - mild tenderness just below medial and lateral malleolus. No puncture wounds. Good capillary refill. Dorsalis pedis pulse intact. Patient has ace wrap on foot.  Ambulating, no limp   Lymphadenopathy:     He has no cervical adenopathy.   Neurological: He is alert and oriented to person, place, and time. He has normal reflexes. No cranial nerve deficit. Coordination normal.   Sensation intact, 5/5 strength in all extremities, 2+ reflexes in all extremities, stable gait    Skin: Skin is warm and dry. He is not diaphoretic.   Psychiatric: He has a normal mood and affect.        MDM     Differential Diagnosis; Clinical Impression; Plan:       Lower back pain and left chest wall pain s/p hx of fall and some overuse in the past. No significant findings on exam. Labs are reassuring. NS given, pt was tachy but had the same in the past. Pt gives conflicting info about narc use, suspect seeking, confronted the pt with PMP records. Will give rx for Robaxin.   Noted Elevated HR today and last visit, reports similar in the past, likely associated with his pain  Fu given.   Discussed results, care in ED and further care, f/u and s/s warranting return to ED. Pt understood and agreed to plan.  Melissa Montane, PA 2:13 PM         Amount and/or Complexity of Data Reviewed:   Clinical lab tests:  Reviewed and ordered   Review and summarize past medical records:  Yes (PMP show 5 scripts for scheduled meds last month, pt failed to disclose despite direct questioning. Melissa Montane, PA )  Risk of Significant Complications, Morbidity, and/or Mortality:   Presenting problems:  Low  Diagnostic procedures:  Low  Management options:  Low  Progress:   Patient progress:  Stable      Procedures      ORDERS:  Orders Placed This Encounter   ??? URINALYSIS W/ RFLX MICROSCOPIC   ??? CBC WITH AUTOMATED DIFF   ??? METABOLIC PANEL, COMPREHENSIVE   ??? LIPASE   ??? gabapentin (NEURONTIN) 100 mg capsule   ??? acetaminophen (TYLENOL) 325 mg tablet   ??? sodium chloride 0.9 % bolus infusion 1,000 mL       MEDICATIONS:  Medications   sodium chloride 0.9 % bolus infusion 1,000 mL (1,000 mL IntraVENous New Bag 08/15/13 1246)         RADIOLOGY RESULTS:    None    LAB & EKG RESULTS:   Recent Results (from the past 12 hour(s))   URINALYSIS W/ RFLX MICROSCOPIC  Collection Time     08/15/13 12:00 PM       Result Value Range    Color YELLOW      Appearance CLEAR      Specific gravity 1.020  1.003 - 1.030      pH (UA) 8.0  5.0 - 8.0      Protein NEGATIVE   NEG mg/dL    Glucose NEGATIVE   NEG mg/dL    Ketone TRACE (*) NEG mg/dL    Bilirubin NEGATIVE   NEG      Blood NEGATIVE   NEG      Urobilinogen 0.2  0.2 - 1.0 EU/dL    Nitrites NEGATIVE   NEG      Leukocyte Esterase NEGATIVE   NEG     CBC WITH AUTOMATED DIFF    Collection Time     08/15/13 12:00 PM       Result Value Range    WBC 7.2  4.6 - 13.2 K/uL    RBC 4.02 (*) 4.70 - 5.50 M/uL    HGB 12.9 (*) 13.0 - 16.0 g/dL    HCT 40.2  36.0 - 48.0 %    MCV 100.0 (*) 74.0 - 97.0 FL    MCH 32.1  24.0 - 34.0 PG    MCHC 32.1  31.0 - 37.0 g/dL    RDW 13.5  11.6 - 14.5 %    PLATELET 365  135 - 420 K/uL    MPV 9.5  9.2 - 11.8 FL    NEUTROPHILS 57  40 - 73 %    LYMPHOCYTES 32  21 - 52 %    MONOCYTES 8  3 - 10 %    EOSINOPHILS 2  0 - 5 %    BASOPHILS 1  0 - 2 %    ABS. NEUTROPHILS 4.2  1.8 - 8.0 K/UL    ABS. LYMPHOCYTES 2.3  0.9 - 3.6 K/UL    ABS. MONOCYTES 0.5  0.05 - 1.2 K/UL    ABS. EOSINOPHILS 0.1  0.0 - 0.4 K/UL    ABS. BASOPHILS 0.1 (*) 0.0 - 0.06 K/UL    DF AUTOMATED     METABOLIC PANEL, COMPREHENSIVE    Collection Time     08/15/13 12:00 PM       Result Value Range    Sodium 144  136 - 145 mmol/L    Potassium 4.2  3.5 - 5.5 mmol/L    Chloride 108  100 - 108 mmol/L    CO2 26  21 - 32 mmol/L    Anion gap 10  3.0 - 18 mmol/L    Glucose 99  74 - 99 mg/dL    BUN 8  7.0 - 18 MG/DL    Creatinine 0.80  0.6 - 1.3 MG/DL    BUN/Creatinine ratio 10 (*) 12 - 20      GFR est AA >60  >60 ml/min/1.42m    GFR est non-AA >60  >60 ml/min/1.716m   Calcium 8.7  8.5 - 10.1 MG/DL    Bilirubin, total 0.2  0.2 - 1.0 MG/DL    ALT 52  16 - 61 U/L    AST 17  15 - 37 U/L    Alk. phosphatase 71  45 - 117 U/L    Protein, total 6.6   6.4 - 8.2 g/dL    Albumin 3.7  3.4 - 5.0 g/dL    Globulin 2.9  2.0 - 4.0 g/dL    A-G Ratio  1.3  0.8 - 1.7     LIPASE    Collection Time     08/15/13 12:00 PM       Result Value Range    Lipase 94  73 - 393 U/L      Diagnosis:   1. Back strain, initial encounter    2. Contusion of ribs, left, initial encounter    3. Elevated blood pressure    4. Tachycardia          Disposition: discharged home with instructions     Apply warm compresses to the area for 15 min 3-4 times a day.   Start gentle stretching as tolerated.   Take Tylenol/Acetaminophen (every 4-6 hours) for pain as needed.   Take Flexeril or Robaxin for muscular discomfort or spasms as needed as directed. Note that this medicine may cause drowsiness.   You have elevated blood pressure readings here. You MUST be further evaluated by a your primary care provider within 2 - 4 days as you may need to develop or change the way to keep your blood pressure under control.   Follow up with your doctor or the provided referral for further evaluation and management   Return to emergency room for worsening or new symptoms.         Follow-up Information    Follow up With Details Comments Contact Info    [facility] [facility] 4 days for primary care and blood pressure monitoring [place] [id number]  [phone]    [facility] [place]  for help to establish care [place] [id number]  [phone]    [facility] EMERGENCY DEPT  As needed if symptoms worsen [address]  [place] [address]  [phone]          Current Discharge Medication List      START taking these medications    Details   methocarbamol (ROBAXIN) 500 mg tablet Take 1 tablet by mouth four (4) times daily for 20 doses.  Qty: 20 tablet, Refills: 0         CONTINUE these medications which have NOT CHANGED    Details   gabapentin (NEURONTIN) 100 mg capsule Take  by mouth three (3) times daily.      acetaminophen (TYLENOL) 325 mg tablet Take  by mouth every four (4)  hours as needed for Pain.      HYDROcodone-acetaminophen (NORCO) 5-325 mg per tablet Take 1 tablet by mouth every four (4) hours as needed for Pain.  Qty: 15 tablet, Refills: 0

## 2013-08-15 NOTE — ED Notes (Signed)
Awaiting MD to sign chart

## 2013-08-23 LAB — DRUG SCREEN, URINE
ACETAMINOPHEN,UR: NEGATIVE
Amphetamine: NEGATIVE
Barbiturates: NEGATIVE
Benzodiazepines: NEGATIVE
Cocaine: NEGATIVE
METHAMPHETAMINE,URINE: NEGATIVE
Marijuana: NEGATIVE
Methadone: NEGATIVE
Opiates: NEGATIVE
Phencyclidine: NEGATIVE
TRICYCLICS SCREEN, URINE: POSITIVE — AB

## 2013-08-23 LAB — ACETAMINOPHEN: Acetaminophen level: <2 ug/mL — ABNORMAL LOW (ref 10.0–30.0)

## 2013-08-23 LAB — METABOLIC PANEL, BASIC
BUN: 6 mg/dl — ABNORMAL LOW (ref 7–25)
CO2: 23 mEq/L (ref 21–32)
Calcium: 8.6 mg/dl (ref 8.5–10.1)
Chloride: 111 mEq/L — ABNORMAL HIGH (ref 98–107)
Creatinine: 1 mg/dl (ref 0.6–1.3)
GFR est AA: >60
GFR est non-AA: >60
Glucose: 85 mg/dl (ref 74–106)
Potassium: 4.2 mEq/L (ref 3.5–5.1)
Sodium: 143 mEq/L (ref 136–145)

## 2013-08-23 LAB — CBC WITH AUTOMATED DIFF
BASOPHILS: 0.9 % (ref 0–3)
EOSINOPHILS: 2.5 % (ref 0–5)
HCT: 37.2 % (ref 37.0–50.0)
HGB: 12.3 gm/dl — ABNORMAL LOW (ref 12.4–17.2)
IMMATURE GRANULOCYTES: 4.4 % — ABNORMAL HIGH (ref 0.0–3.0)
LYMPHOCYTES: 26.7 % — ABNORMAL LOW (ref 28–48)
MCH: 31.6 pg (ref 23.0–34.6)
MCHC: 33.1 gm/dl (ref 30.0–36.0)
MCV: 95.6 fL (ref 80.0–98.0)
MONOCYTES: 8.2 % (ref 1–13)
MPV: 9.6 fL (ref 6.0–10.0)
NEUTROPHILS: 57.3 % (ref 34–64)
NRBC: 0 (ref 0–0)
PLATELET: 325 10*3/uL (ref 140–450)
RBC: 3.89 M/uL (ref 3.80–5.70)
RDW-SD: 47.3 — ABNORMAL HIGH (ref 35.1–43.9)
WBC: 8.2 10*3/uL (ref 4.0–11.0)

## 2013-08-23 LAB — ETHYL ALCOHOL: ALCOHOL(ETHYL),SERUM: <3 mg/dl (ref 0.0–9.0)

## 2013-08-23 LAB — SALICYLATE: Salicylate: 1.8 mg/dl — ABNORMAL LOW (ref 2.8–20.0)

## 2013-08-23 NOTE — ED Provider Notes (Signed)
[facility]  EMERGENCY DEPARTMENT TREATMENT REPORT  NAME:  Barry Crawford  SEX:   M  ADMIT: 08/23/2013  DOB:   October 14, 1985  MR#    [id number]  ROOM:    TIME DICTATED: 03 50 PM  ACCT#  [id number]        TIME OF EVALUATION:  1435    CHIEF COMPLAINT:  Anxiety.    HISTORY OF PRESENT ILLNESS:  The patient is a 28 year old male with a history of anxiety coming in for   assessment today of panic attacks that have been increasing more recently over   the past couple days.  He has had them for years.  He was evaluated at   [facility] at the beginning of January.  He says a few weeks ago he   was there for a week.  He has been on Risperdal and divalproex at home, but   his symptoms of anxiety attacks, having increased in frequency.  He has been   unable to control the symptoms at home.  He says his wife typically is able to   calm him down, but she has been out of town and will not return for a few   days.  He continues to have racing thoughts, racing heart rate and feels very   panicked at times.  He also has visual and auditory hallucinations.  He says   he will see things like the wall melting.  He knows the difference he states   between reality and his hallucinations, as he is quite familiar with them.  He   is denying any acute suicidal or homicidal ideation.    ASSESSMENT AND MANAGEMENT PLAN:  The patient has chronic back pain with no acute change in that.  No other   complaints of pain or discomfort at this time.    REVIEW OF SYSTEMS:  CONSTITUTIONAL:  No fevers.  EYES:   No visual symptoms.  ENT:  No URI symptoms.  ENDOCRINE:  No diabetic symptoms.  RESPIRATORY:  No shortness of breath.  CARDIOVASCULAR:  He does have palpitations.  GASTROINTESTINAL:  No abdominal pain or vomiting.  GENITOURINARY:  No problems with urination.  MUSCULOSKELETAL:  Chronic back pain.  INTEGUMENTARY:  No open wounds or rashes.  NEUROLOGICAL:  No headaches.  PSYCHIATRIC:  Positive anxiety, no suicidal or homicidal ideation.     PAST MEDICAL HISTORY:  Anxiety.    FAMILY:  The patient does not provide.    SOCIAL HISTORY:  He denies alcohol, tobacco or substance abuse.    ALLERGIES:  REVIEWED IN IBEX.    MEDICATIONS:  Multiple and reviewed in Ibex.    PHYSICAL EXAMINATION:  VITAL SIGNS:  Blood pressure 140/89, pulse 138, respirations 20, temperature   98.3, saturations 100% on room air.  GENERAL APPEARANCE:  Patient appears well developed and well nourished.    Appearance and behavior are age and situation appropriate.  The patient is   sitting up in the bed, does not appear in acute distress at assessment.  He is   calm and provides information readily.  HEENT:  Oral mucosa pink and moist.  Teeth and gums unremarkable.  NECK:  Supple, symmetrical.  Trachea is midline.  RESPIRATORY:  Respirations clear and equal with no appreciable respiratory   distress.   No wheezes, rales, rhonchi or cough.    CARDIOVASCULAR:  Heart is tachycardic, regular without appreciable murmurs,   rubs, gallops or thrills.  GASTROINTESTINAL:  Abdomen soft, nontender to palpation.  No guarding or   rebound.  No palpable masses.    MUSCULOSKELETAL:  He moves all extremities appropriately.  SKIN:  Warm and dry.  NEUROLOGIC:  He is alert and oriented times 3.  PSYCHIATRIC:  Oriented to time, place and person.  Mood and affect    appropriate.  He is calm and cooperative.  He does seem a little nervous as we   discuss his current history.  Denies any suicidal or homicidal ideation.    INITIAL ASSESSMENT AND MANAGEMENT PLAN:   This is a 28 year old male coming in for acute anxiety but is admitting to   hallucinations.  We are going to have crisis clinician, Kenyon AnaKurt see him.  He does   not seem to be a threat to himself or others.  His heart is racing, his heart   rate 138.  I am going to give him some Ativan to help calm him down a little   and reassess.  We will do labs necessary to clear him for potential placement   in the psych facility.    DIAGNOSTIC INTERPRETATIONS:   CBC and BMP unremarkable with a hemoglobin and hematocrit 12.3 and 37.2,   chloride 111.  The rest of the patient's labs pending at this time.  To this   point he has been given a p.o. Ativan and will be assessed by crisis   clinician, Beth.    DIAGNOSES:  1.  Anxiety, generalized.    2.  Hallucinations.    DISPOSITIONS:  He is in stable condition pending psych holding status, awaiting psychiatric   evaluation.    The patient was personally evaluated by myself and Dr. Floyce Stakesodd Latice Waitman who agrees   with the above assessment and plan.      ___________________  Johny Drillingodd A Brigit Doke MD  Dictated By: Bettey MareShannon R. Earlene PlaterWallace, PA-C    My signature above authenticates this document and my orders, the final  diagnosis (es), discharge prescription (s), and instructions in the [facility]   Pulsecheck record.  Nursing notes have been reviewed by the physician/mid-level provider.    If you have any questions please contact [phone].    BAB  D:08/23/2013 15:50:55  T: 08/23/2013 21:59:35  [id number]  Authenticated by Johny Drillingodd A. Evalee Gerard, M.D. On 09/01/2013 04:10:11 PM

## 2013-08-24 NOTE — ED Provider Notes (Signed)
[facility]  EMERGENCY DEPARTMENT TREATMENT REPORT  NAME:  Barry Crawford  SEX:   M  ADMIT: 08/24/2013  DOB:   1986/02/03  MR#    [id number]  ROOM:    TIME DICTATED: 10 43 AM  ACCT#  [id number]    cc: Royal Hawthorn GEDDES MD    TIME OF EVALUATION:   10:15    PRIMARY CARE PHYSICIAN:  None.    PSYCHIATRIST:  Dr. Baldemar Lenis    CHIEF COMPLAINT:  Weakness.    HISTORY OF PRESENT ILLNESS:  This is a 28 year old male with a history of anxiety, panic attacks,   hallucinations who was just evaluated in the emergency department last night   for hallucinations and anxiety attack, evaluated by crisis clinician, referred   to [facility] this morning, where he had an appointment.  He made that appointment   and then his father actually brought the patient in because he stated the   patient was not acting quite right.  When I asked him what he meant exactly,   he said it was because he normally has a lot of energy and he just seems like   he is not having a lot of energy and he is a little bit tired today.  The   patient himself did not have any desire to return to the emergency department.    His father actually, again, brought him in because he stated that he was not   acting quite right.  The patient does tell me that he is feeling a little bit   weak and that he is still having some minor hallucinations, he describes as   the walls melting.  He had been seen here yesterday for hallucinations, which   he states were much more vivid, and they have subsequently decreased.  He   tells me that he is not in any pain other than the chronic back pain that he   suffers from normally.  Otherwise, denies any fever, cough, chest pain,   shortness of breath, abdominal pain.  No nausea, vomiting.  No diarrhea.     REVIEW OF SYSTEMS:   CONSTITUTIONAL:  No fever, chills, or weight loss.  RESPIRATORY:   No cough, shortness of breath, or wheezing.   C ARDIOVASCULAR:  No chest pain, chest pressure, or palpitations.   NEUROLOGIC:  No headache.  Positive for weakness.  PSYCHIATRIC:  No suicidal or homicidal ideations, but positive for   hallucinations.       PAST MEDICAL HISTORY:  History of generalized anxiety disorder, peptic ulcer disease, bipolar,   depression, schizophrenia.    PAST SURGICAL HISTORY:  None.    FAMILY HISTORY:  Noncontributory to this case.    SOCIAL HISTORY:  The patient smokes cigarettes.  Denies alcohol or drug abuse.    ALLERGIES:  TRAMADOL.    MEDICATIONS:  Risperdal, divalproex, gabapentin, Flexeril, Cogentin.    PHYSICAL EXAMINATION:  VITAL SIGNS:  His blood pressure is 140/89, pulse 138, respiratory rate 20,   temperature 98.3, satting at 100% on room air.  CONSTITUTIONAL:  Well-developed, well-nourished appearing 28 year old male,   sitting comfortably on the bed.  He does not appear to be in any acute   distress.  He is cooperative with my evaluation.  RESPIRATORY:  Clear and equal breath sounds.  No respiratory distress,   tachypnea, or accessory muscle use.    CARDIOVASCULAR:   Heart regular, without murmurs, gallops, rubs, or thrills.   GI:  Abdomen soft, nontender, without complaint  of pain to palpation.  No   hepatomegaly or splenomegaly.  MUSCULOSKELETAL:  Nails:  No clubbing or deformities.  Nailbeds pink with   prompt capillary refill.  NEUROLOGIC:  The patient is alert and oriented times 3.  Sensation appears to   be intact.  Motor strength is equal and symmetric bilaterally.  No facial   asymmetry or dysarthria.  His speech flow is a little bit slow and deliberate.    He is a little ataxic when he walks.  He has a negative Romberg.  PSYCHIATRIC:  Judgment appears appropriate.    CONTINUATION BY JEFFREY PADGETT, PA-C     IMPRESSION AND PLAN:  This is a 28 year old male brought in by his father with a complaint of   weakness.  The patient was seen here yesterday for hallucinations and panic   attack.  The patient himself actually has no complaints other than he is    feeling a little bit weak and that he is having some very minor hallucinations   which have decreased significantly since yesterday.  I will have him   evaluated by the crisis clinician.  We will see if we can get him set up for   an appointment to [facility] and then will be able to discharge him with his father.        COURSE IN THE EMERGENCY DEPARTMENT:     The patient was evaluated by crisis clinician who spoke with [facility], had him set   up with an appointment today at 4 o'clock to be evaluated by the psychiatrist   there.  Discussed that plan with the patient.  The patient understood and was   agreeable to that plan.  He felt the patient was ready for discharge and we   agreed.    DIAGNOSES:    1.  Weakness.  2.  Hallucinations.    DISPOSITION:   The patient remained completely stable in his course in the Emergency   Department.  He was discharged home with his father in a stable condition.    Advised to continue the medications as prescribed by his primary care doctor.    Advised to make the followup appointment as scheduled today at [facility] with the   psychiatrist.  Again, the patient verbalizes understanding.    The patient was seen and evaluated by myself and Dr. Buddy DutyZydron who agrees with   the above assessment and plan.      ___________________  Lucio Edwardourtney T Jamylah Marinaccio MD  Dictated By: Fransisca KaufmannJeffrey Padgett, PA-C    My signature above authenticates this document and my orders, the final  diagnosis (es), discharge prescription (s), and instructions in the [facility]   Pulsecheck record.  Nursing notes have been reviewed by the physician/mid-level provider.    If you have any questions please contact [phone].    BAB  D:08/24/2013 10:43:33  T: 08/24/2013 16:20:45  [id number]  Authenticated by Lucio EdwardOURTNEY T Deleah Tison, MD On 09/03/2013 03:41:43 AM

## 2014-08-08 NOTE — ED Provider Notes (Addendum)
[facility]  EMERGENCY [facility] TREATMENT REPORT  NAME:  Barry HurstKIM, Barry Crawford  SEX:   M  ADMIT: 08/08/2014  DOB:   1986/06/17  MR#    [id number]  ROOM:    TIME DICTATED: 01 04 PM  ACCT#  [id number]        TIME SEEN:  11:00 a.m.    CHIEF COMPLAINT:  Back pain.    PRIMARY CARE PHYSICIAN:  None.    HISTORY OF PRESENT ILLNESS:  This is a 29 year old male with history of chronic low back pain, presents to  the ED with an exacerbation of that low back pain.  He states that he has been  diagnosed with herniated disk in his low back after a fall from 20 feet a few  years ago.  States that the back pain has been exacerbated [place]  It is located in his low back on the right side with some  radiation to his right leg, has been going on since Thursday.  States that it  is his typical sciatica, has been taking ibuprofen and Naprosyn and leftover  Flexeril with no relief.  He denies any incontinence of his bowel or bladder.  No paresthesias of the lower extremities or fever.  No recent trauma or  injury.  He did state that he was dropped off and is able to get a ride home.    REVIEW OF SYSTEMS:  CONSTITUTIONAL:  No fever.  RESPIRATORY:  No cough, shortness of breath.  CARDIOVASCULAR:  No chest pain.  MUSCULOSKELETAL:  Positive for right lower back pain.  NEUROLOGIC:  No paresthesias.    PAST MEDICAL HISTORY:  Chronic back pain, two herniated disks, sciatica, peptic ulcer disease.    PSYCHIATRIC HISTORY:    Bipolar, depression, schizophrenia.    SOCIAL HISTORY:  The patient with tobacco dependency, drinks socially, denies drug use.    MEDICATIONS:  Unknown.    ALLERGIES:    AZITHROMYCIN, PREDNISONE AND TRAMADOL.    PHYSICAL EXAMINATION:  VITAL SIGNS:  BP 133/92, pulse 104, respirations 16, temperature 97.4, O2 sats  100% on room air, pain 9 out of 10.   GENERAL APPEARANCE:  Well-developed, well-nourished, young male who appears to  be uncomfortable and in some mild distress due to pain while sitting at  the  edge of the stretcher.    HEENT:  Eyes:  Conjunctivae are clear, nonicteric.    MUSCULOSKELETAL:  The patient with no midline tenderness of the cervical,  thoracic, lumbar or sacral spine.  The patient with tenderness to the right  paraspinal region of the lumbar spine also along his sciatic notch.  SKIN:  Warm and dry without rashes.  NEUROLOGICAL:  The patient is alert and oriented, answers questions  appropriately.  Sensation intact.  Motor strength equal and symmetric of the  lower extremities.  The patient with good posterior tibialis pulses   bilaterally.    INITIAL ASSESSMENT AND MANAGEMENT PLAN:  This is a 29 year old male with history of chronic low back pain with an  exacerbation after prolonged trip.  We will treat him symptomatically.    COURSE IN THE EMERGENCY DEPARTMENT:   The patient remained stable throughout his stay in the ER.  He had received  p.o.  Norco, p.o. Tylenol and Valium before discharge.  I instructed him to  follow up with his primary care or Dr. Elson ClanNancy Kamel in the next 2 to 3 days,  not to drive or operate machinery while medicated.  He agreed to this plan and  all his questions were answered.    DIAGNOSIS:  Acute on chronic low back pain.    DISPOSITION:  The patient discharged home in stable condition with instructions as stated  above.  Also to return to the ER if condition worsens or new symptoms develop.  Prescription for Norco, ibuprofen and Robaxin were given.  The patient was  personally evaluated by myself and Dr. Vinnie Langton who agrees with the above  assessment and plan.      ___________________  Smitty Cords MD  Dictated By: Asencion Islam, PA-C    My signature above authenticates this document and my orders, the final  diagnosis (es), discharge prescription (s), and instructions in the [facility]  Pulsecheck record.  Nursing notes have been reviewed by the physician/mid-level provider.    If you have any questions please contact [phone].    LH  D:08/08/2014 13:04:16   T: 08/08/2014 13:16:43  [id number]  Electronically Authenticated by:  Smitty Cords, M.D. On 08/08/2014 04:03 PM EST

## 2021-01-24 ENCOUNTER — Other Ambulatory Visit: Payer: Self-pay

## 2021-01-24 ENCOUNTER — Emergency Department (HOSPITAL_COMMUNITY)
Admission: EM | Admit: 2021-01-24 | Discharge: 2021-01-25 | Discharge status: Against Medical Advice | Payer: BC Managed Care – PPO | Attending: Student | Admitting: Student

## 2021-01-24 ENCOUNTER — Emergency Department (HOSPITAL_COMMUNITY): Payer: BC Managed Care – PPO

## 2021-01-24 ENCOUNTER — Encounter (HOSPITAL_COMMUNITY): Payer: Self-pay | Admitting: Emergency Medicine

## 2021-01-24 DIAGNOSIS — Z5321 Procedure and treatment not carried out due to patient leaving prior to being seen by health care provider: Secondary | ICD-10-CM | POA: Diagnosis not present

## 2021-01-24 DIAGNOSIS — M25511 Pain in right shoulder: Secondary | ICD-10-CM | POA: Diagnosis not present

## 2021-01-24 DIAGNOSIS — M542 Cervicalgia: Secondary | ICD-10-CM | POA: Insufficient documentation
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
## 2021-01-25 LAB — BASIC METABOLIC PANEL
Anion gap: 12 (ref 5–15)
BUN: 17 mg/dL (ref 6–20)
CO2: 20 mmol/L — ABNORMAL LOW (ref 22–32)
Calcium: 8.9 mg/dL (ref 8.9–10.3)
Chloride: 108 mmol/L (ref 98–111)
Creatinine, Ser: 1.01 mg/dL (ref 0.61–1.24)
GFR, Estimated: >60 mL/min (ref >60)
Glucose, Bld: 162 mg/dL — ABNORMAL HIGH (ref 70–99)
Potassium: 3.8 mmol/L (ref 3.5–5.1)
Sodium: 140 mmol/L (ref 135–145)

## 2021-01-25 LAB — CBC
HCT: 39.9 % (ref 39.0–52.0)
Hemoglobin: 13.2 g/dL (ref 13.0–17.0)
MCH: 32.3 pg (ref 26.0–34.0)
MCHC: 33.1 g/dL (ref 30.0–36.0)
MCV: 97.6 fL (ref 80.0–100.0)
Platelets: 374 10*3/uL (ref 150–400)
RBC: 4.09 MIL/uL — ABNORMAL LOW (ref 4.22–5.81)
RDW: 13.6 % (ref 11.5–15.5)
WBC: 16 10*3/uL — ABNORMAL HIGH (ref 4.0–10.5)
nRBC: 0 % (ref 0.0–0.2)

## 2021-01-25 NOTE — ED Notes (Signed)
Pt called for room, no response. 

## 2022-05-19 IMAGING — CT CT CERVICAL SPINE W/O CM
3 of 4 series · 10 of 33 positions shown, 12 images · non-contrast
Comparison: None.

CLINICAL DATA: Neck pain fell from tree

EXAM:
CT CERVICAL SPINE WITHOUT CONTRAST
TECHNIQUE: Multidetector CT imaging of the cervical spine was performed without
intravenous contrast. Multiplanar CT image reconstructions were also
generated.

[Series 5: c_spine 2.0 st · axial · 0.26mm/px · z∈[-250,-188]mm · 2 of 94 slices shown, 3 images]
[im 32/94  soft-tissue]
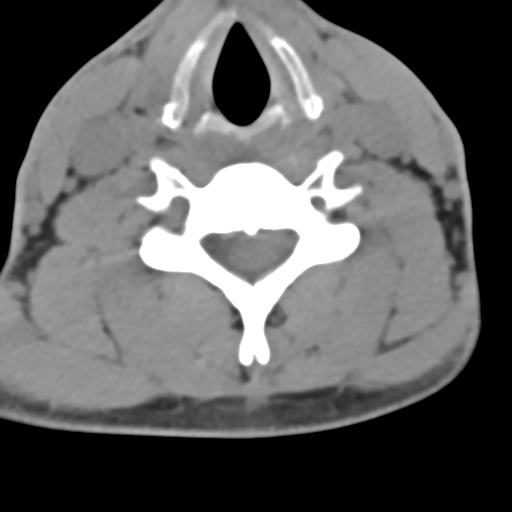
[im 32/94  bone]
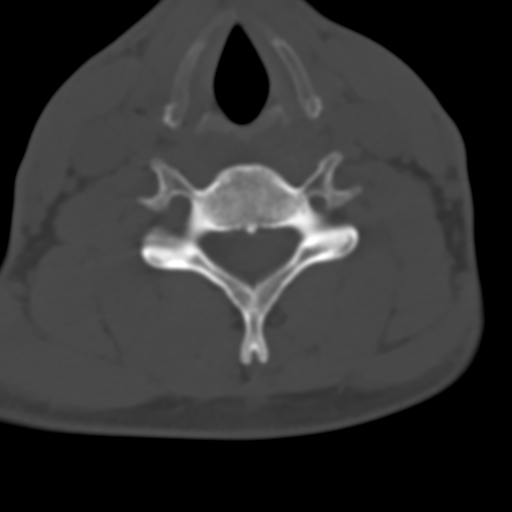
[im 63/94  bone]
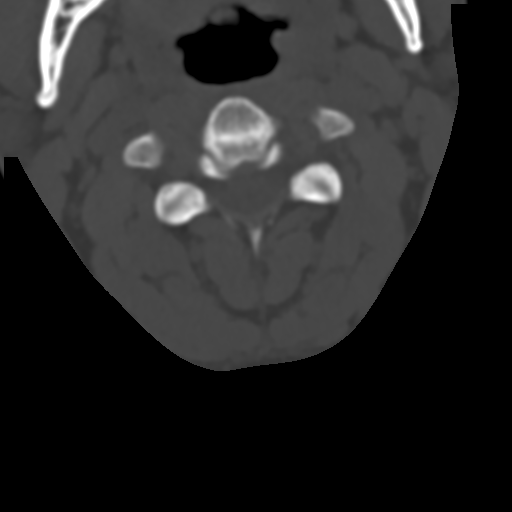

[Series 6: coronal bone · coronal · 0.23mm/px · 3 of 61 slices shown]
[im 13/61  bone]
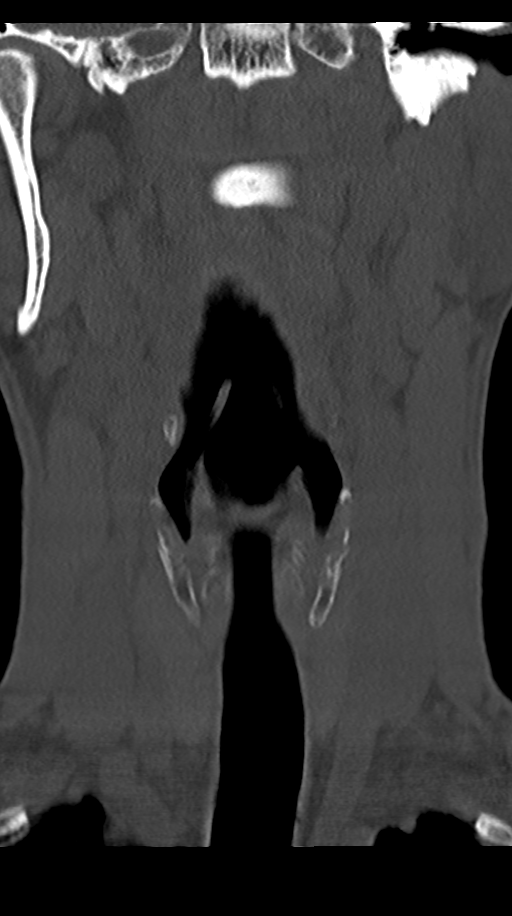
[im 25/61  bone]
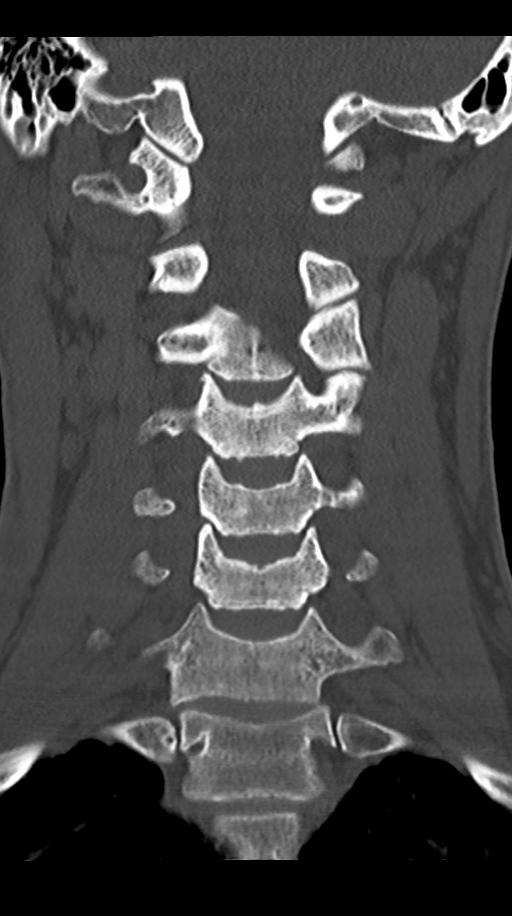
[im 37/61  bone]
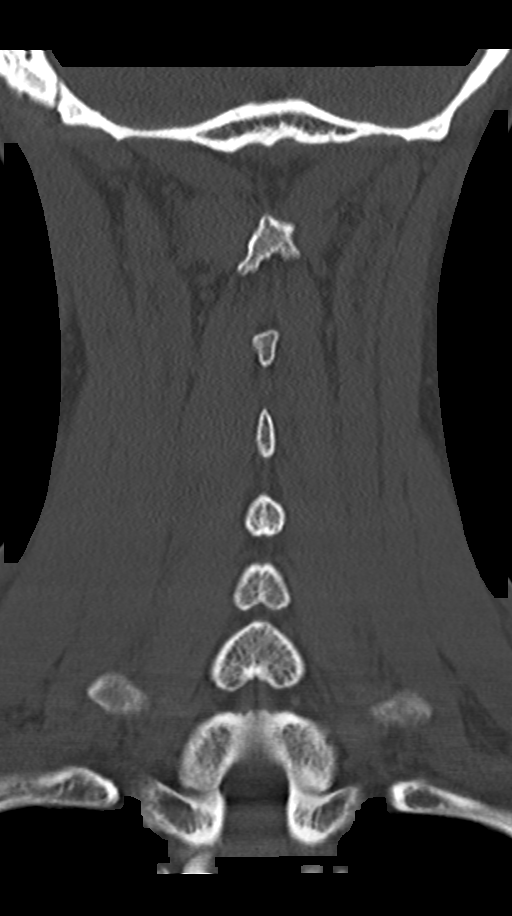

[Series 7: sagittal bone · sagittal · 0.21mm/px · 5 of 61 slices shown, 6 images]
[im 21/61  bone]
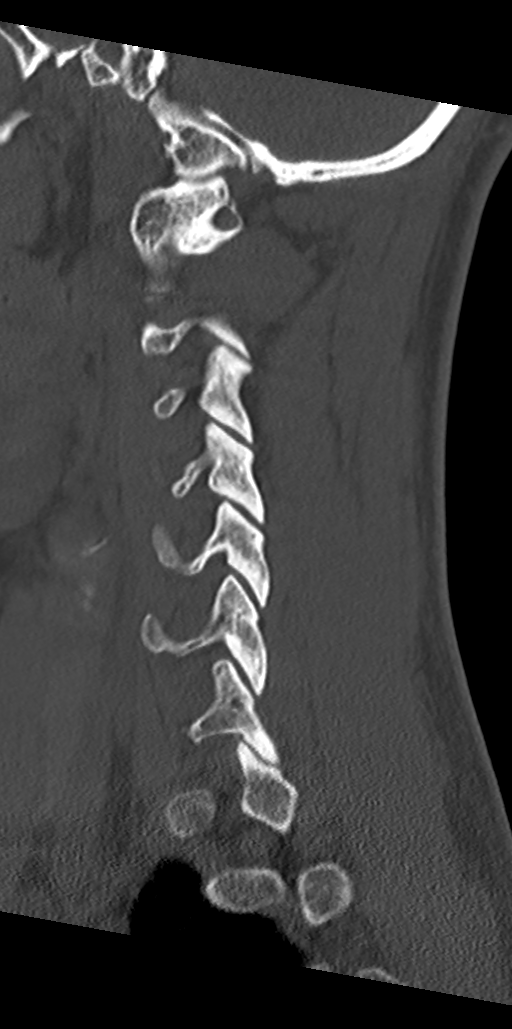
[im 26/61  bone]
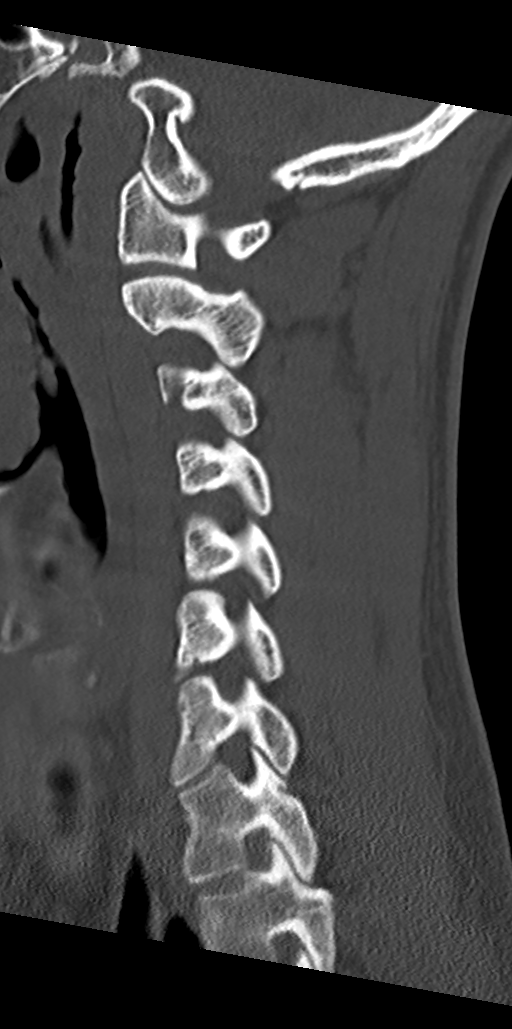
[im 31/61  soft-tissue]
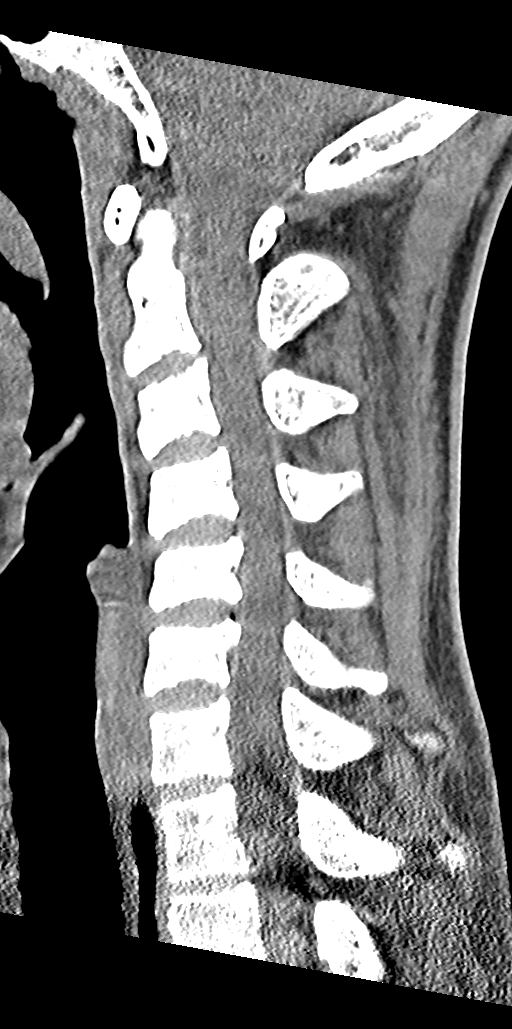
[im 31/61  bone]
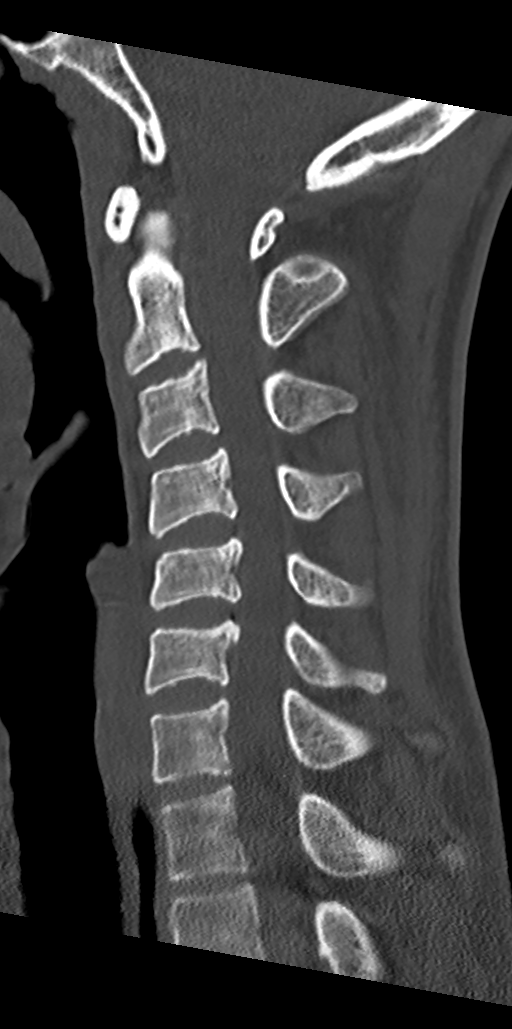
[im 36/61  bone]
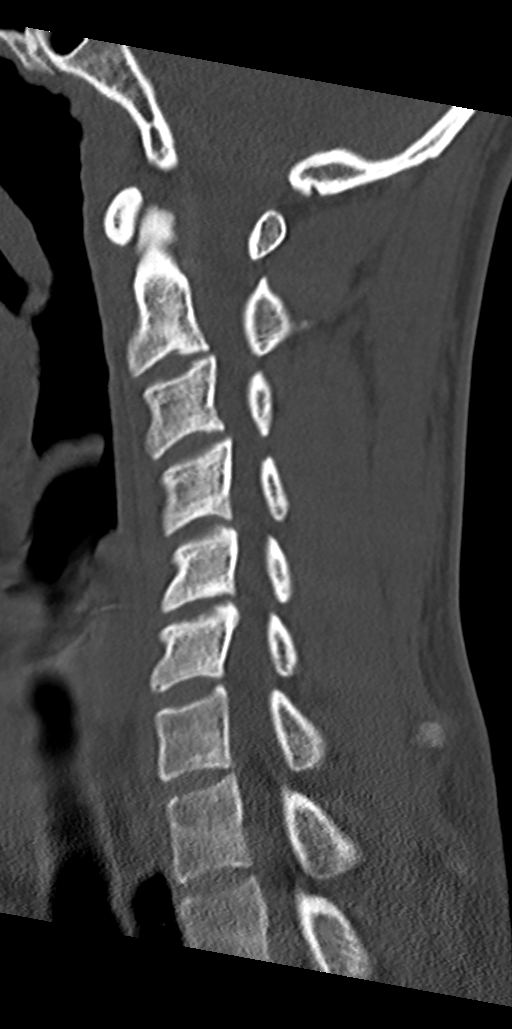
[im 41/61  bone]
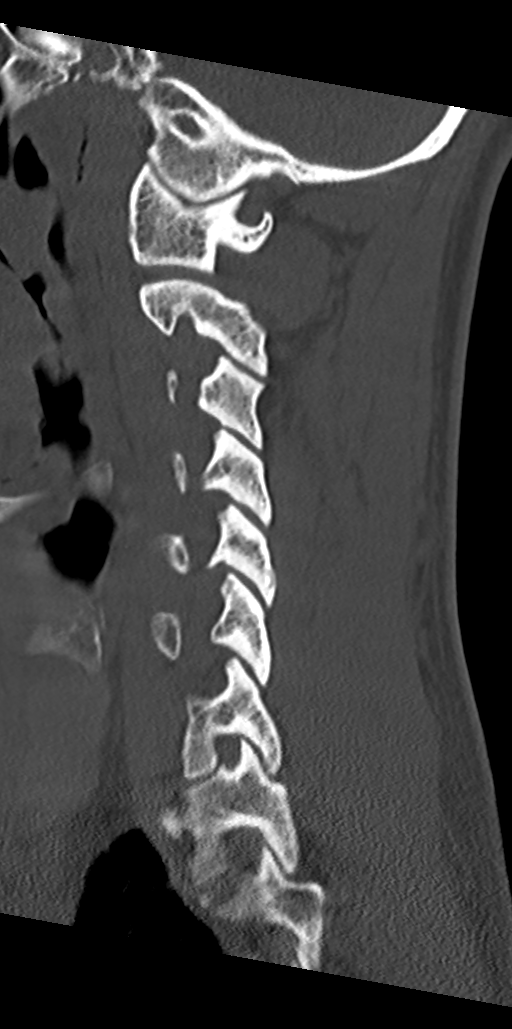

[10 of 33 positions shown; findings below may reference images not displayed]

FINDINGS: Alignment: Mild reversal of cervical lordosis. No subluxation. Facet
alignment within normal limits.

Skull base and vertebrae: No acute fracture. No primary bone lesion
or focal pathologic process.

Soft tissues and spinal canal: No prevertebral fluid or swelling. No
visible canal hematoma.

Disc levels:  Disc spaces are within normal limits.

Upper chest: Apical emphysema

Other: None
IMPRESSION: Mild reversal of cervical lordosis. No CT evidence for acute osseous
abnormality. Apical emphysema
